# Patient Record
Sex: Male | Born: 1960 | Race: White | Hispanic: No | Marital: Single | Smoking: Current every day smoker
Health system: Southern US, Community
[De-identification: ages and names within clinical notes are randomized; demographics above are authoritative.]

---

## 2011-09-10 ENCOUNTER — Emergency Department (HOSPITAL_BASED_OUTPATIENT_CLINIC_OR_DEPARTMENT_OTHER)
Admission: EM | Admit: 2011-09-10 | Discharge: 2011-09-10 | Disposition: A | Discharge status: Home / Self Care | Payer: Self-pay | Attending: Emergency Medicine | Admitting: Emergency Medicine

## 2011-09-10 ENCOUNTER — Encounter (HOSPITAL_BASED_OUTPATIENT_CLINIC_OR_DEPARTMENT_OTHER): Payer: Self-pay | Admitting: *Deleted

## 2011-09-10 ENCOUNTER — Emergency Department (INDEPENDENT_AMBULATORY_CARE_PROVIDER_SITE_OTHER): Payer: Self-pay

## 2011-09-10 DIAGNOSIS — R071 Chest pain on breathing: Secondary | ICD-10-CM | POA: Insufficient documentation

## 2011-09-10 DIAGNOSIS — M79609 Pain in unspecified limb: Secondary | ICD-10-CM | POA: Insufficient documentation

## 2011-09-10 DIAGNOSIS — R079 Chest pain, unspecified: Secondary | ICD-10-CM

## 2011-09-10 DIAGNOSIS — R0602 Shortness of breath: Secondary | ICD-10-CM

## 2011-09-10 DIAGNOSIS — R0789 Other chest pain: Secondary | ICD-10-CM

## 2011-09-10 MED ORDER — TRAMADOL HCL 50 MG PO TABS: 50.0000 mg | ORAL_TABLET | Freq: Four times a day (QID) | ORAL | Status: AC | PRN

## 2011-09-10 MED ORDER — IBUPROFEN 800 MG PO TABS: 800.0000 mg | ORAL_TABLET | Freq: Three times a day (TID) | ORAL | Status: AC

## 2011-09-10 MED ORDER — KETOROLAC TROMETHAMINE 60 MG/2ML IM SOLN
60.0000 mg | Freq: Once | INTRAMUSCULAR | Status: AC
  Administered 2011-09-10: 60 mg via INTRAMUSCULAR
  Filled 2011-09-10: qty 2

## 2011-09-10 NOTE — ED Notes (Signed)
Pt states he was horsing around last week and he fell and ?injured himself in the right axillary region. Worse in the morning, with cough and movement. Denies other s/s.

## 2011-09-10 NOTE — ED Provider Notes (Signed)
History   This chart was scribed for Gerhard Munch, MD by Melba Coon. The patient was seen in room MH06/MH06 and the patient's care was started at 5:00PM.    CSN: [id number]  Arrival date & time 09/10/11  1539   First MD Initiated Contact with Patient 09/10/11 1620      Chief Complaint  Patient presents with  . Muscle Pain    (Consider location/radiation/quality/duration/timing/severity/associated sxs/prior treatment) HPI Phillip Greene is a 51 y.o. male who presents to the Emergency Department complaining of constant, moderate to severe right axillary pain with an onset 3 days ago. Pt accidentally fell and injured himself. Coughing aggravates the pain; moving his torso or ROM of right arm does not aggravte pain. No LOC or n/v/d. No other pertinent medical problems. Pt took pain meds at home (Back-aids from [place]).  5:05PM - EDMD will give anti-inflammatory meds and applied ice as well as perform chest XRs because of his course lung sounds.   History reviewed. No pertinent past medical history.  History reviewed. No pertinent past surgical history.  History reviewed. No pertinent family history.  History  Substance Use Topics  . Smoking status: Current Everyday Smoker  . Smokeless tobacco: Not on file  . Alcohol Use: No      Review of Systems 10 Systems reviewed and are negative for acute change except as noted in the HPI.  Allergies  Review of patient's allergies indicates no known allergies.  Home Medications   Current Outpatient Rx  Name Route Sig Dispense Refill  . ADULT MULTIVITAMIN W/MINERALS CH Oral Take 1 tablet by mouth daily.    Marland Kitchen OVER THE COUNTER MEDICATION Oral Take 1,000 mg by mouth once.      BP 156/100  Pulse 60  Temp(Src) 98.3 F (36.8 C) (Oral)  Resp 20  Ht 5\' 8"  (1.727 m)  Wt 150 lb (68.04 kg)  BMI 22.81 kg/m2  SpO2 100%  Physical Exam  Nursing note and vitals reviewed. Constitutional: He is oriented to person, place, and time. He  appears well-developed and well-nourished.  HENT:  Head: Normocephalic and atraumatic.  Right Ear: External ear normal.  Left Ear: External ear normal.  Eyes: Conjunctivae and EOM are normal. Pupils are equal, round, and reactive to light. No scleral icterus.  Neck: Normal range of motion. Neck supple. No thyromegaly present.  Cardiovascular: Normal rate and regular rhythm.  Exam reveals no gallop and no friction rub.   No murmur heard. Pulmonary/Chest: Effort normal and breath sounds normal. No stridor. He has no wheezes. He has no rales. He exhibits no tenderness.       Course lung sounds  Abdominal: Soft. Bowel sounds are normal. He exhibits no distension. There is no tenderness. There is no rebound.  Musculoskeletal: Normal range of motion. He exhibits tenderness (right axillary region). He exhibits no edema.  Lymphadenopathy:    He has no cervical adenopathy.  Neurological: He is alert and oriented to person, place, and time. Coordination normal.  Skin: Skin is warm. No rash noted. No erythema.  Psychiatric: He has a normal mood and affect. His behavior is normal.    ED Course  Procedures (including critical care time)  DIAGNOSTIC STUDIES: Oxygen Saturation is 100% on room air, normal by my interpretation.   CXR reviewed by me  COORDINATION OF CARE:     Labs Reviewed - No data to display No results found.   No diagnosis found.    MDM  I personally performed the services described  in this documentation, which was scribed in my presence. The recorded information has been reviewed and considered.  As generally well young male presents with right sided superior pectoral muscle pain.  The patient's description of pain beginning after a strenuous events, the absence of distress, the unremarkable evaluation are suggestive of musculoskeletal etiology.  The patient's absence of hypoxia, tachypnea, pleuritic pain his reassuring for the very low probability of pulmonary embolism.   The patient we discharged with anti-inflammatories, ice packs, PMD followup        Gerhard Munch, MD 09/10/11 1810

## 2013-02-24 IMAGING — CR DG CHEST 2V
2 series · 2 of 2 positions shown · non-contrast
Comparison: None.

CLINICAL DATA: Shortness of breath, right-sided chest pain

CHEST - 2 VIEW

[w chest pa]
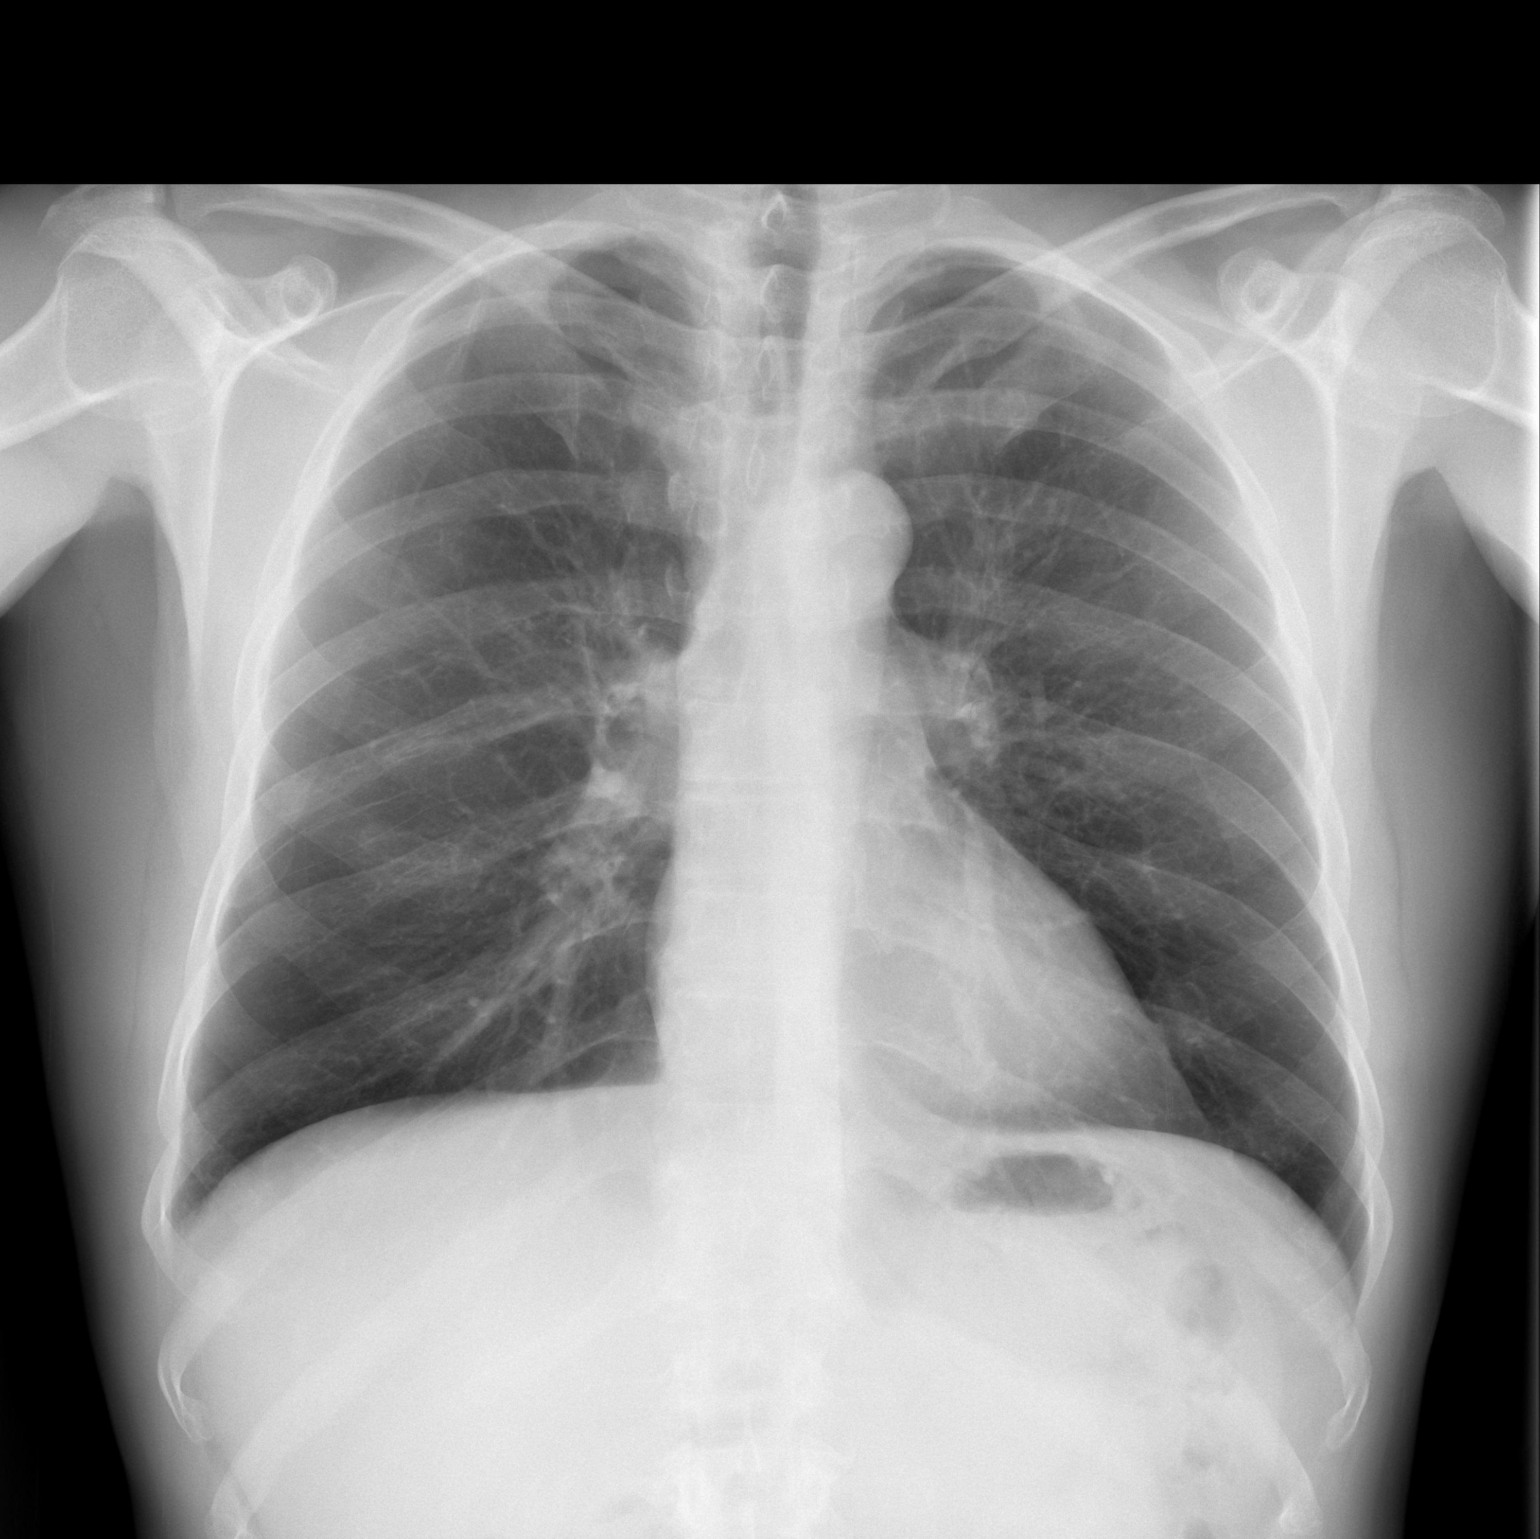

[w chest lat]
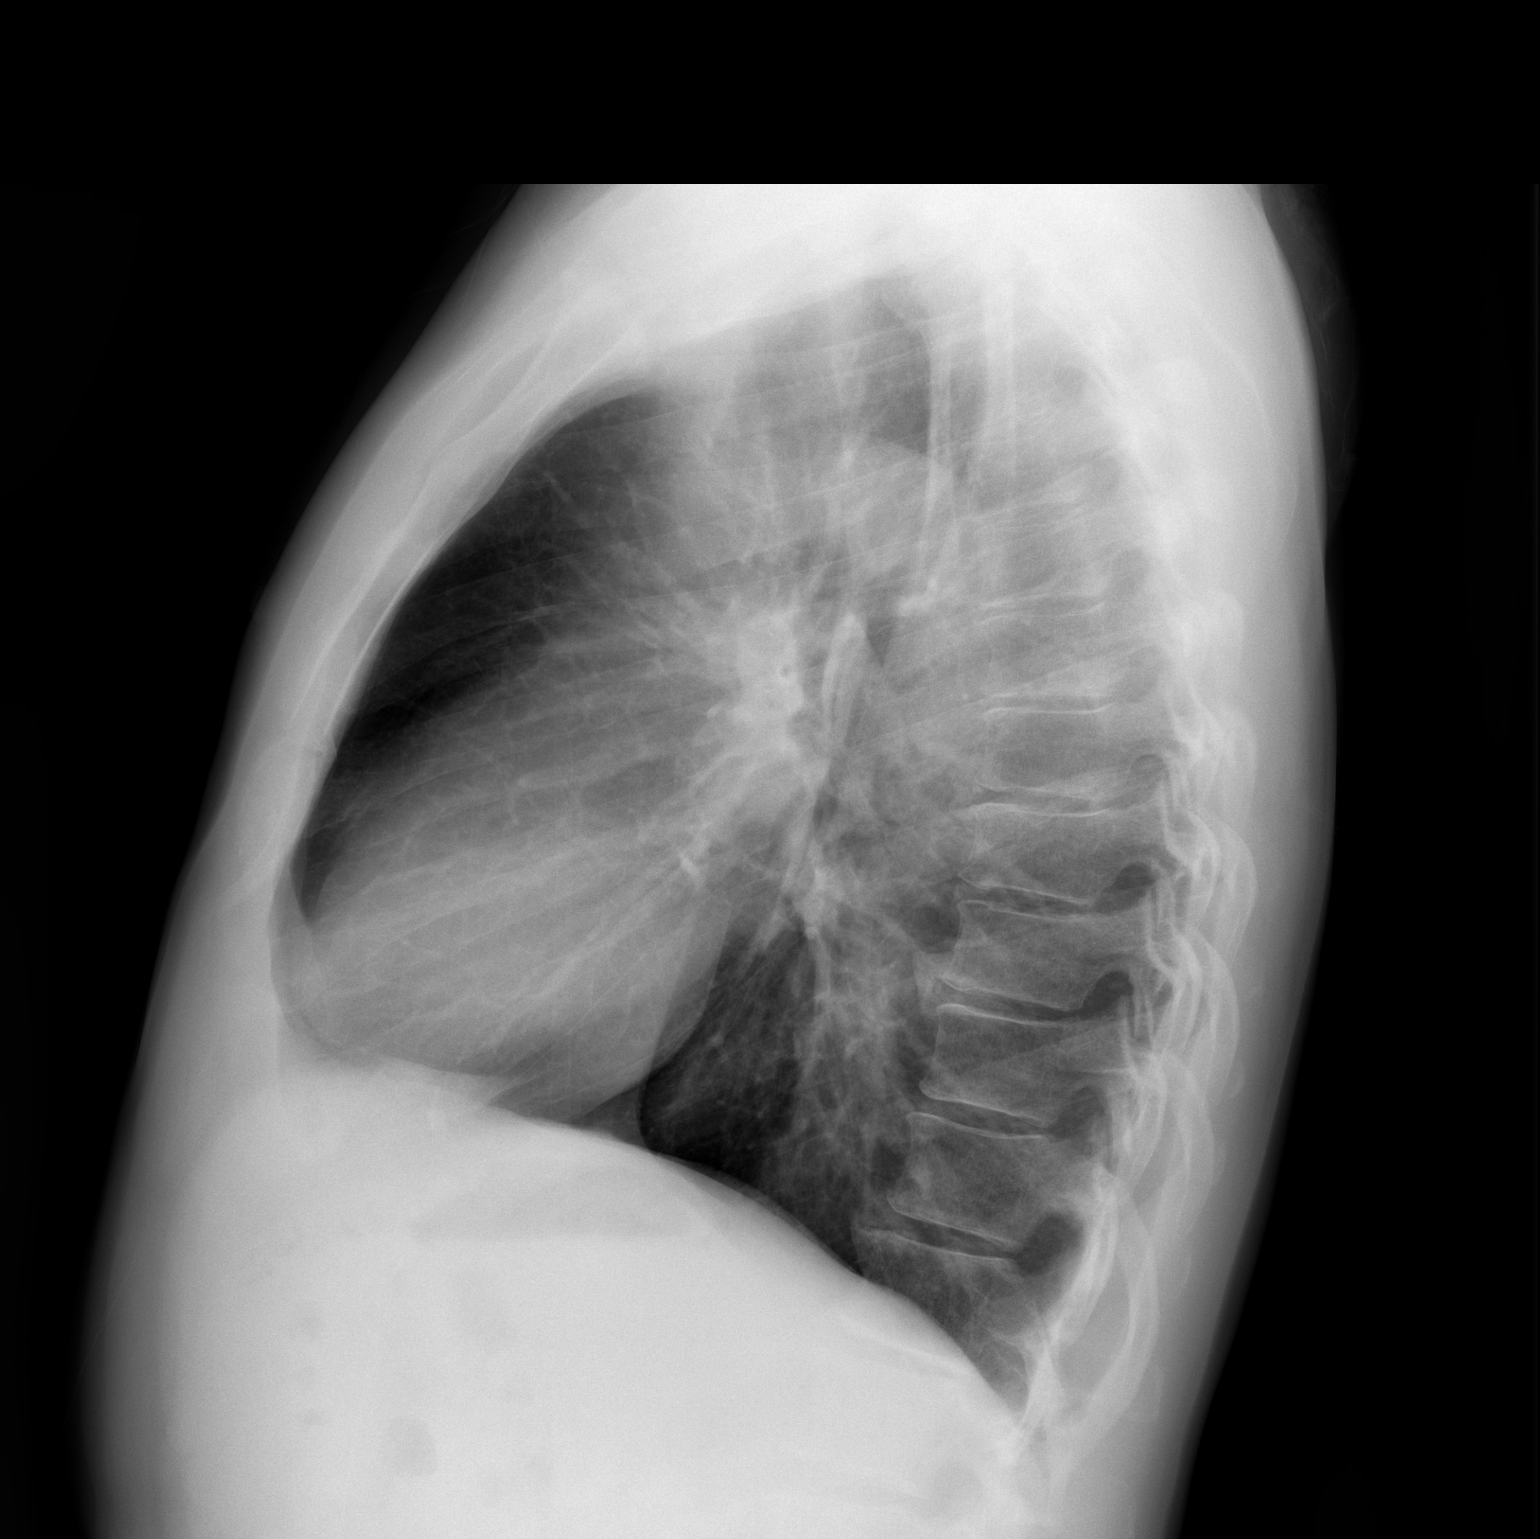

[2 of 2 positions shown; findings below may reference images not displayed]

FINDINGS: Cardiomediastinal silhouette is within normal limits. The
lungs are clear. No pleural effusion.  No pneumothorax.  No acute
osseous abnormality.
IMPRESSION: Normal chest.

## 2015-05-04 ENCOUNTER — Emergency Department (HOSPITAL_BASED_OUTPATIENT_CLINIC_OR_DEPARTMENT_OTHER)
Admission: EM | Admit: 2015-05-04 | Discharge: 2015-05-04 | Disposition: A | Discharge status: Home / Self Care | Payer: Self-pay | Attending: Emergency Medicine | Admitting: Emergency Medicine

## 2015-05-04 ENCOUNTER — Encounter (HOSPITAL_BASED_OUTPATIENT_CLINIC_OR_DEPARTMENT_OTHER): Payer: Self-pay | Admitting: *Deleted

## 2015-05-04 DIAGNOSIS — Z720 Tobacco use: Secondary | ICD-10-CM | POA: Insufficient documentation

## 2015-05-04 DIAGNOSIS — R040 Epistaxis: Secondary | ICD-10-CM | POA: Insufficient documentation

## 2015-05-04 MED ORDER — OXYMETAZOLINE HCL 0.05 % NA SOLN
1.0000 | Freq: Once | NASAL | Status: AC
  Administered 2015-05-04: 1 via NASAL

## 2015-05-04 MED ORDER — OXYMETAZOLINE HCL 0.05 % NA SOLN
NASAL | Status: AC
  Administered 2015-05-04: 1 via NASAL
  Filled 2015-05-04: qty 15

## 2015-05-04 NOTE — ED Notes (Signed)
Patient states he developed a nose bleed three nights ago.  States at the time he developed the nose bleed, he was cutting fiberglass, however he was wearing a mask. States he was seen at an ED on Friday, but has continued to have nose bleeds all weekend.

## 2015-05-04 NOTE — ED Provider Notes (Signed)
CSN: [id number]     Arrival date & time 05/04/15  4098 History   First MD Initiated Contact with Patient 05/04/15 1003     Chief Complaint  Patient presents with  . Epistaxis     (Consider location/radiation/quality/duration/timing/severity/associated sxs/prior Treatment) Patient is a 54 y.o. male presenting with nosebleeds.  Epistaxis Location:  L nare Severity:  Moderate Duration:  3 days Timing:  Sporadic Progression:  Waxing and waning Chronicity:  Recurrent Context: weather change   Context: not anticoagulants, not aspirin use and not trauma   Relieved by:  Nothing Worsened by:  Nothing tried Ineffective treatments:  Applying pressure and ice Associated symptoms: no blood in oropharynx, no congestion, no fever and no sneezing     History reviewed. No pertinent past medical history. No past surgical history on file. No family history on file. Social History  Substance Use Topics  . Smoking status: Current Every Day Smoker  . Smokeless tobacco: None  . Alcohol Use: No    Review of Systems  Constitutional: Negative for fever.  HENT: Positive for nosebleeds. Negative for congestion and sneezing.   All other systems reviewed and are negative.     Allergies  Review of patient's allergies indicates no known allergies.  Home Medications   Prior to Admission medications   Medication Sig Start Date End Date Taking? Authorizing Provider  Multiple Vitamin (MULITIVITAMIN WITH MINERALS) TABS Take 1 tablet by mouth daily.    Historical Provider, MD  OVER THE COUNTER MEDICATION Take 1,000 mg by mouth once.    Historical Provider, MD   BP 129/81 mmHg  Pulse 100  Temp(Src) 98.4 F (36.9 C) (Oral)  Resp 20  Ht  (1.702 m)  Wt 150 lb (68.04 kg)  BMI 23.49 kg/m2  SpO2 100% Physical Exam  Constitutional: He is oriented to person, place, and time. He appears well-developed and well-nourished. No distress.  HENT:  Head: Normocephalic and atraumatic.  Nose: No  mucosal edema, nasal deformity, septal deviation or nasal septal hematoma. No epistaxis (with dried blood of left nare, no active bleeding).  Eyes: Conjunctivae are normal.  Neck: Neck supple. No tracheal deviation present.  Cardiovascular: Normal rate and regular rhythm.   Pulmonary/Chest: Effort normal. No respiratory distress.  Abdominal: Soft. He exhibits no distension.  Neurological: He is alert and oriented to person, place, and time.  Skin: Skin is warm and dry.  Psychiatric: He has a normal mood and affect.    ED Course  Procedures (including critical care time) Labs Review Labs Reviewed - No data to display  Imaging Review No results found. I have personally reviewed and evaluated these images and lab results as part of my medical decision-making.   EKG Interpretation None      MDM   Final diagnoses:  Epistaxis, recurrent    54 year old male presents with recurrent left-sided epistaxis that occurred initially 3 days ago, resolved with pressure and ice, recurred today. He is tried pressure and ice but no pharmacologic therapy and no packing. There is no active bleeding in the emergency department. He is provided with a bottle of Afrin to prevent rebleeding and I recommended scheduling this medication over the next 3 days but no longer. Plan to establish PCP as needed and return precautions discussed for worsening or new concerning symptoms.     Lyndal Pulley, MD 05/04/15 [phone]

## 2015-05-04 NOTE — Discharge Instructions (Signed)
